# Patient Record
Sex: Male | Born: 1990
Health system: Southern US, Community
[De-identification: ages and names within clinical notes are randomized; demographics above are authoritative.]

---

## 2019-01-30 ENCOUNTER — Other Ambulatory Visit: Payer: Self-pay

## 2019-01-30 DIAGNOSIS — Z20822 Contact with and (suspected) exposure to covid-19: Secondary | ICD-10-CM

## 2019-01-31 LAB — NOVEL CORONAVIRUS, NAA: SARS-CoV-2, NAA: NOT DETECTED

## 2019-02-09 ENCOUNTER — Telehealth: Payer: Self-pay | Admitting: General Practice

## 2019-02-09 NOTE — Telephone Encounter (Signed)
Patient is calling for his negative COVID test results. Patient expressed understanding. °

## 2020-07-23 ENCOUNTER — Observation Stay (HOSPITAL_COMMUNITY): Payer: Self-pay

## 2020-07-23 ENCOUNTER — Inpatient Hospital Stay (HOSPITAL_COMMUNITY)
Admission: EM | Admit: 2020-07-23 | Discharge: 2020-07-27 | DRG: 199 | Disposition: A | Discharge status: Home / Self Care | Payer: Self-pay | Attending: Surgery | Admitting: Surgery

## 2020-07-23 ENCOUNTER — Emergency Department (HOSPITAL_COMMUNITY): Payer: Self-pay

## 2020-07-23 ENCOUNTER — Encounter (HOSPITAL_COMMUNITY): Payer: Self-pay

## 2020-07-23 ENCOUNTER — Other Ambulatory Visit: Payer: Self-pay

## 2020-07-23 DIAGNOSIS — S00511A Abrasion of lip, initial encounter: Secondary | ICD-10-CM | POA: Diagnosis present

## 2020-07-23 DIAGNOSIS — S0993XA Unspecified injury of face, initial encounter: Secondary | ICD-10-CM

## 2020-07-23 DIAGNOSIS — Z4682 Encounter for fitting and adjustment of non-vascular catheter: Secondary | ICD-10-CM

## 2020-07-23 DIAGNOSIS — F17200 Nicotine dependence, unspecified, uncomplicated: Secondary | ICD-10-CM | POA: Diagnosis present

## 2020-07-23 DIAGNOSIS — Z23 Encounter for immunization: Secondary | ICD-10-CM

## 2020-07-23 DIAGNOSIS — S270XXA Traumatic pneumothorax, initial encounter: Principal | ICD-10-CM | POA: Diagnosis present

## 2020-07-23 DIAGNOSIS — U071 COVID-19: Secondary | ICD-10-CM | POA: Diagnosis present

## 2020-07-23 DIAGNOSIS — F1910 Other psychoactive substance abuse, uncomplicated: Secondary | ICD-10-CM | POA: Diagnosis present

## 2020-07-23 DIAGNOSIS — F10921 Alcohol use, unspecified with intoxication delirium: Secondary | ICD-10-CM | POA: Diagnosis present

## 2020-07-23 DIAGNOSIS — Y908 Blood alcohol level of 240 mg/100 ml or more: Secondary | ICD-10-CM | POA: Diagnosis present

## 2020-07-23 DIAGNOSIS — Z9689 Presence of other specified functional implants: Secondary | ICD-10-CM

## 2020-07-23 DIAGNOSIS — F1210 Cannabis abuse, uncomplicated: Secondary | ICD-10-CM | POA: Diagnosis present

## 2020-07-23 DIAGNOSIS — J939 Pneumothorax, unspecified: Secondary | ICD-10-CM | POA: Diagnosis present

## 2020-07-23 DIAGNOSIS — S2242XA Multiple fractures of ribs, left side, initial encounter for closed fracture: Secondary | ICD-10-CM | POA: Diagnosis present

## 2020-07-23 LAB — RAPID URINE DRUG SCREEN, HOSP PERFORMED
Amphetamines: NOT DETECTED
Barbiturates: NOT DETECTED
Benzodiazepines: POSITIVE — AB
Cocaine: NOT DETECTED
Opiates: NOT DETECTED
Tetrahydrocannabinol: POSITIVE — AB

## 2020-07-23 LAB — URINALYSIS, ROUTINE W REFLEX MICROSCOPIC
Bilirubin Urine: NEGATIVE
Glucose, UA: NEGATIVE mg/dL
Hgb urine dipstick: NEGATIVE
Ketones, ur: 20 mg/dL — AB
Leukocytes,Ua: NEGATIVE
Nitrite: NEGATIVE
Protein, ur: NEGATIVE mg/dL
Specific Gravity, Urine: >1.046 — ABNORMAL HIGH (ref 1.005–1.030)
pH: 6 (ref 5.0–8.0)

## 2020-07-23 LAB — CBC WITH DIFFERENTIAL/PLATELET
Abs Immature Granulocytes: 0.06 10*3/uL (ref 0.00–0.07)
Basophils Absolute: 0 10*3/uL (ref 0.0–0.1)
Basophils Relative: 0 %
Eosinophils Absolute: 0 10*3/uL (ref 0.0–0.5)
Eosinophils Relative: 0 %
HCT: 49.9 % (ref 39.0–52.0)
Hemoglobin: 17 g/dL (ref 13.0–17.0)
Immature Granulocytes: 1 %
Lymphocytes Relative: 11 %
Lymphs Abs: 1.4 10*3/uL (ref 0.7–4.0)
MCH: 32.4 pg (ref 26.0–34.0)
MCHC: 34.1 g/dL (ref 30.0–36.0)
MCV: 95.2 fL (ref 80.0–100.0)
Monocytes Absolute: 0.5 10*3/uL (ref 0.1–1.0)
Monocytes Relative: 4 %
Neutro Abs: 10.6 10*3/uL — ABNORMAL HIGH (ref 1.7–7.7)
Neutrophils Relative %: 84 %
Platelets: 189 10*3/uL (ref 150–400)
RBC: 5.24 MIL/uL (ref 4.22–5.81)
RDW: 13.3 % (ref 11.5–15.5)
WBC: 12.5 10*3/uL — ABNORMAL HIGH (ref 4.0–10.5)
nRBC: 0 % (ref 0.0–0.2)

## 2020-07-23 LAB — COMPREHENSIVE METABOLIC PANEL
ALT: 22 U/L (ref 0–44)
AST: 29 U/L (ref 15–41)
Albumin: 4.5 g/dL (ref 3.5–5.0)
Alkaline Phosphatase: 60 U/L (ref 38–126)
Anion gap: 11 (ref 5–15)
BUN: 7 mg/dL (ref 6–20)
CO2: 23 mmol/L (ref 22–32)
Calcium: 8.5 mg/dL — ABNORMAL LOW (ref 8.9–10.3)
Chloride: 109 mmol/L (ref 98–111)
Creatinine, Ser: 0.76 mg/dL (ref 0.61–1.24)
GFR, Estimated: >60 mL/min (ref >60)
Glucose, Bld: 125 mg/dL — ABNORMAL HIGH (ref 70–99)
Potassium: 3.7 mmol/L (ref 3.5–5.1)
Sodium: 143 mmol/L (ref 135–145)
Total Bilirubin: 0.7 mg/dL (ref 0.3–1.2)
Total Protein: 7.3 g/dL (ref 6.5–8.1)

## 2020-07-23 LAB — HIV ANTIBODY (ROUTINE TESTING W REFLEX): HIV Screen 4th Generation wRfx: NONREACTIVE

## 2020-07-23 LAB — RESP PANEL BY RT-PCR (FLU A&B, COVID) ARPGX2
Influenza A by PCR: NEGATIVE
Influenza B by PCR: NEGATIVE
SARS Coronavirus 2 by RT PCR: POSITIVE — AB

## 2020-07-23 LAB — ETHANOL: Alcohol, Ethyl (B): 245 mg/dL — ABNORMAL HIGH (ref <10)

## 2020-07-23 LAB — LACTIC ACID, PLASMA: Lactic Acid, Venous: 2.5 mmol/L (ref 0.5–1.9)

## 2020-07-23 LAB — CK: Total CK: 819 U/L — ABNORMAL HIGH (ref 49–397)

## 2020-07-23 LAB — AMMONIA: Ammonia: 35 umol/L (ref 9–35)

## 2020-07-23 LAB — CBG MONITORING, ED: Glucose-Capillary: 117 mg/dL — ABNORMAL HIGH (ref 70–99)

## 2020-07-23 MED ORDER — ONDANSETRON 4 MG PO TBDP: 4.0000 mg | ORAL_TABLET | Freq: Four times a day (QID) | ORAL | Status: DC | PRN

## 2020-07-23 MED ORDER — FENTANYL CITRATE (PF) 100 MCG/2ML IJ SOLN
50.0000 ug | Freq: Once | INTRAMUSCULAR | Status: AC
  Administered 2020-07-23: 50 ug via INTRAVENOUS
  Filled 2020-07-23: qty 2

## 2020-07-23 MED ORDER — KCL IN DEXTROSE-NACL 20-5-0.45 MEQ/L-%-% IV SOLN
INTRAVENOUS | Status: DC
  Filled 2020-07-23: qty 1000

## 2020-07-23 MED ORDER — TETANUS-DIPHTH-ACELL PERTUSSIS 5-2.5-18.5 LF-MCG/0.5 IM SUSY
0.5000 mL | PREFILLED_SYRINGE | Freq: Once | INTRAMUSCULAR | Status: AC
  Administered 2020-07-23: 0.5 mL via INTRAMUSCULAR
  Filled 2020-07-23: qty 0.5

## 2020-07-23 MED ORDER — IOHEXOL 300 MG/ML  SOLN
100.0000 mL | Freq: Once | INTRAMUSCULAR | Status: AC | PRN
  Administered 2020-07-23: 100 mL via INTRAVENOUS

## 2020-07-23 MED ORDER — ENOXAPARIN SODIUM 30 MG/0.3ML ~~LOC~~ SOLN
30.0000 mg | Freq: Two times a day (BID) | SUBCUTANEOUS | Status: DC
  Administered 2020-07-24 – 2020-07-27 (×7): 30 mg via SUBCUTANEOUS
  Filled 2020-07-23 (×8): qty 0.3

## 2020-07-23 MED ORDER — DOCUSATE SODIUM 100 MG PO CAPS
100.0000 mg | ORAL_CAPSULE | Freq: Two times a day (BID) | ORAL | Status: DC
  Administered 2020-07-23 – 2020-07-27 (×8): 100 mg via ORAL
  Filled 2020-07-23 (×8): qty 1

## 2020-07-23 MED ORDER — HYDROCODONE-ACETAMINOPHEN 5-325 MG PO TABS
1.0000 | ORAL_TABLET | ORAL | Status: DC | PRN
  Administered 2020-07-23 – 2020-07-24 (×2): 1 via ORAL
  Filled 2020-07-23 (×2): qty 1

## 2020-07-23 MED ORDER — MORPHINE SULFATE (PF) 2 MG/ML IV SOLN
2.0000 mg | INTRAVENOUS | Status: DC | PRN
  Administered 2020-07-23 – 2020-07-24 (×5): 2 mg via INTRAVENOUS
  Filled 2020-07-23 (×5): qty 1

## 2020-07-23 MED ORDER — ONDANSETRON HCL 4 MG/2ML IJ SOLN
4.0000 mg | Freq: Once | INTRAMUSCULAR | Status: AC
  Administered 2020-07-23: 4 mg via INTRAVENOUS
  Filled 2020-07-23: qty 2

## 2020-07-23 MED ORDER — ZIPRASIDONE MESYLATE 20 MG IM SOLR
20.0000 mg | Freq: Once | INTRAMUSCULAR | Status: AC
  Administered 2020-07-23: 20 mg via INTRAMUSCULAR
  Filled 2020-07-23: qty 20

## 2020-07-23 MED ORDER — ONDANSETRON HCL 4 MG/2ML IJ SOLN: 4.0000 mg | Freq: Four times a day (QID) | INTRAMUSCULAR | Status: DC | PRN

## 2020-07-23 MED ORDER — SODIUM CHLORIDE 0.9 % IV BOLUS
1000.0000 mL | Freq: Once | INTRAVENOUS | Status: AC
  Administered 2020-07-23: 1000 mL via INTRAVENOUS

## 2020-07-23 MED ORDER — LORAZEPAM 2 MG/ML IJ SOLN
1.0000 mg | Freq: Once | INTRAMUSCULAR | Status: DC
  Filled 2020-07-23: qty 1

## 2020-07-23 NOTE — ED Notes (Signed)
[facility] called to transport patient to [facility]

## 2020-07-23 NOTE — ED Notes (Signed)
Bear hugger device applied to pt.

## 2020-07-23 NOTE — ED Notes (Signed)
Report given to [facility]. 
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
## 2020-07-23 NOTE — ED Notes (Signed)
Pt belongings located in cabinet behind nurses station [id number]

## 2020-07-23 NOTE — ED Notes (Signed)
Condom cath in place. Will give urine sample when able.

## 2020-07-23 NOTE — ED Notes (Signed)
RN will call back to receive report
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
## 2020-07-24 ENCOUNTER — Encounter (HOSPITAL_COMMUNITY): Payer: Self-pay

## 2020-07-24 ENCOUNTER — Observation Stay (HOSPITAL_COMMUNITY): Payer: Self-pay

## 2020-07-24 DIAGNOSIS — U071 COVID-19: Secondary | ICD-10-CM | POA: Diagnosis present

## 2020-07-24 DIAGNOSIS — F1910 Other psychoactive substance abuse, uncomplicated: Secondary | ICD-10-CM | POA: Diagnosis present

## 2020-07-24 DIAGNOSIS — J939 Pneumothorax, unspecified: Secondary | ICD-10-CM

## 2020-07-24 LAB — FERRITIN: Ferritin: 55 ng/mL (ref 24–336)

## 2020-07-24 LAB — D-DIMER, QUANTITATIVE: D-Dimer, Quant: 0.45 ug/mL-FEU (ref 0.00–0.50)

## 2020-07-24 LAB — FIBRINOGEN: Fibrinogen: 326 mg/dL (ref 210–475)

## 2020-07-24 LAB — PROCALCITONIN: Procalcitonin: <0.1 ng/mL

## 2020-07-24 LAB — C-REACTIVE PROTEIN: CRP: 5.3 mg/dL — ABNORMAL HIGH (ref <1.0)

## 2020-07-24 LAB — LACTATE DEHYDROGENASE: LDH: 150 U/L (ref 98–192)

## 2020-07-24 MED ORDER — OXYCODONE HCL 5 MG PO TABS
5.0000 mg | ORAL_TABLET | ORAL | Status: DC | PRN
  Administered 2020-07-24 – 2020-07-27 (×11): 10 mg via ORAL
  Filled 2020-07-24 (×11): qty 2

## 2020-07-24 MED ORDER — ACETAMINOPHEN 500 MG PO TABS
1000.0000 mg | ORAL_TABLET | Freq: Four times a day (QID) | ORAL | Status: DC
  Administered 2020-07-24 – 2020-07-27 (×11): 1000 mg via ORAL
  Filled 2020-07-24 (×14): qty 2

## 2020-07-24 MED ORDER — SODIUM CHLORIDE 0.9 % IV SOLN
100.0000 mg | Freq: Every day | INTRAVENOUS | Status: AC
  Administered 2020-07-25 – 2020-07-26 (×2): 100 mg via INTRAVENOUS
  Filled 2020-07-24 (×2): qty 20

## 2020-07-24 MED ORDER — METHOCARBAMOL 500 MG PO TABS
1000.0000 mg | ORAL_TABLET | Freq: Three times a day (TID) | ORAL | Status: DC
  Administered 2020-07-24 – 2020-07-27 (×11): 1000 mg via ORAL
  Filled 2020-07-24 (×10): qty 2

## 2020-07-24 MED ORDER — MORPHINE SULFATE (PF) 2 MG/ML IV SOLN
2.0000 mg | INTRAVENOUS | Status: DC | PRN
  Administered 2020-07-25 (×2): 2 mg via INTRAVENOUS
  Administered 2020-07-25: 4 mg via INTRAVENOUS
  Administered 2020-07-25 (×2): 2 mg via INTRAVENOUS
  Administered 2020-07-25: 4 mg via INTRAVENOUS
  Administered 2020-07-26 (×2): 2 mg via INTRAVENOUS
  Filled 2020-07-24 (×2): qty 1
  Filled 2020-07-24: qty 2
  Filled 2020-07-24: qty 1
  Filled 2020-07-24 (×2): qty 2
  Filled 2020-07-24 (×2): qty 1

## 2020-07-24 MED ORDER — KETOROLAC TROMETHAMINE 15 MG/ML IJ SOLN
15.0000 mg | Freq: Four times a day (QID) | INTRAMUSCULAR | Status: DC
  Administered 2020-07-24 – 2020-07-27 (×13): 15 mg via INTRAVENOUS
  Filled 2020-07-24 (×13): qty 1

## 2020-07-24 MED ORDER — SODIUM CHLORIDE 0.9 % IV SOLN
200.0000 mg | Freq: Once | INTRAVENOUS | Status: AC
  Administered 2020-07-24: 200 mg via INTRAVENOUS
  Filled 2020-07-24: qty 40
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
## 2020-07-24 NOTE — Progress Notes (Signed)
Received call from [facility] Radiology to notify RN chest Xray showed 20% increase in Left Pneumothorax, Rn notified Sherrie George PA with Surgery
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
## 2020-07-25 ENCOUNTER — Inpatient Hospital Stay (HOSPITAL_COMMUNITY): Payer: Self-pay

## 2020-07-25 MED ORDER — LIDOCAINE HCL (CARDIAC) PF 100 MG/5ML IV SOSY
PREFILLED_SYRINGE | INTRAVENOUS | Status: AC
  Filled 2020-07-25: qty 5

## 2020-07-25 MED ORDER — LIDOCAINE HCL URETHRAL/MUCOSAL 2 % EX GEL
1.0000 "application " | CUTANEOUS | Status: AC
  Filled 2020-07-25: qty 11

## 2020-07-25 MED ORDER — LIDOCAINE HCL 2 % IJ SOLN
20.0000 mL | Freq: Once | INTRAMUSCULAR | Status: DC
  Filled 2020-07-25: qty 20
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
## 2020-07-25 NOTE — Procedures (Signed)
   Procedure Note  Date: 07/25/2020  Procedure: tube thoracostomy--left    Pre-op diagnosis: left pneumothorax  Post-op diagnosis: same  Surgeon: Diamantina Monks, MD  Anesthesia: local   EBL: <5cc procedural Drains/Implants: 76F chest tube Specimen: none  Description of procedure: Time-out was performed verifying correct patient, procedure, site, laterality, and signature of informed consent. Five cc's of local anesthetic was infiltrated into the tissues just over the fourth intercostal space.  A small skin nick was made at the fourth intercostal space. An introducer needle was inserted and a guidewire inserted through the needle. The needle was removed and the tract dilated. The chest tube was inserted over the guidewire and the guidewire removed.   The tube was secured at the skin with suture and connected to an atrium at -20cm water wall suction. Immediate output from the chest tube was 0cc. The site was dressed with gauze and tape. The patient tolerated the procedure well. There were no complications. Follow up chest x-ray was ordered to confirm tube positioning, complete evacuation, and complete lung re-expansion.    Diamantina Monks, MD General and Trauma Surgery [facility] Surgery

## 2020-07-25 NOTE — Plan of Care (Signed)
  Problem: Education: Goal: Knowledge of General Education information will improve Description: Including pain rating scale, medication(s)/side effects and non-pharmacologic comfort measures Outcome: Progressing   Problem: Clinical Measurements: Goal: Ability to maintain clinical measurements within normal limits will improve Outcome: Progressing Goal: Will remain free from infection Outcome: Progressing Goal: Diagnostic test results will improve Outcome: Progressing Goal: Respiratory complications will improve Outcome: Progressing Goal: Cardiovascular complication will be avoided Outcome: Progressing   Problem: Activity: Goal: Risk for activity intolerance will decrease Outcome: Progressing   Problem: Nutrition: Goal: Adequate nutrition will be maintained Outcome: Progressing   Problem: Coping: Goal: Level of anxiety will decrease Outcome: Progressing   Problem: Respiratory: Goal: Will maintain a patent airway Outcome: Progressing Goal: Complications related to the disease process, condition or treatment will be avoided or minimized Outcome: Progressing
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
## 2020-07-25 NOTE — Progress Notes (Signed)
Consent for chest tube placement signed, Dr. Bedelia Person and Tresa Endo, PA at bedside placing chest tube.

## 2020-07-25 NOTE — Plan of Care (Signed)
  Problem: Safety: Goal: Violent Restraint(s) Outcome: Progressing   Problem: Education: Goal: Knowledge of General Education information will improve Description: Including pain rating scale, medication(s)/side effects and non-pharmacologic comfort measures Outcome: Progressing   Problem: Health Behavior/Discharge Planning: Goal: Ability to manage health-related needs will improve Outcome: Progressing   Problem: Clinical Measurements: Goal: Ability to maintain clinical measurements within normal limits will improve Outcome: Progressing Goal: Will remain free from infection Outcome: Progressing Goal: Diagnostic test results will improve Outcome: Progressing Goal: Respiratory complications will improve Outcome: Progressing Goal: Cardiovascular complication will be avoided Outcome: Progressing   Problem: Activity: Goal: Risk for activity intolerance will decrease Outcome: Progressing   Problem: Nutrition: Goal: Adequate nutrition will be maintained Outcome: Progressing   Problem: Coping: Goal: Level of anxiety will decrease Outcome: Progressing   Problem: Elimination: Goal: Will not experience complications related to bowel motility Outcome: Progressing Goal: Will not experience complications related to urinary retention Outcome: Progressing   Problem: Pain Managment: Goal: General experience of comfort will improve Outcome: Progressing   Problem: Safety: Goal: Ability to remain free from injury will improve Outcome: Progressing   Problem: Skin Integrity: Goal: Risk for impaired skin integrity will decrease Outcome: Progressing   Problem: Education: Goal: Knowledge of risk factors and measures for prevention of condition will improve Outcome: Progressing   Problem: Coping: Goal: Psychosocial and spiritual needs will be supported Outcome: Progressing   Problem: Respiratory: Goal: Will maintain a patent airway Outcome: Progressing Goal: Complications  related to the disease process, condition or treatment will be avoided or minimized Outcome: Progressing
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
## 2020-07-26 ENCOUNTER — Inpatient Hospital Stay (HOSPITAL_COMMUNITY): Payer: Self-pay

## 2020-07-26 MED ORDER — ORAL CARE MOUTH RINSE
15.0000 mL | Freq: Two times a day (BID) | OROMUCOSAL | Status: DC
  Administered 2020-07-26 – 2020-07-27 (×3): 15 mL via OROMUCOSAL

## 2020-07-26 NOTE — Progress Notes (Signed)
Patient's lip noted to have minimal bleeding this AM - when assessed, patient's inner lip (L side) is cracked and swollen; oral care done; moisturizer applied.
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
## 2020-07-27 ENCOUNTER — Other Ambulatory Visit: Payer: Self-pay | Admitting: General Surgery

## 2020-07-27 ENCOUNTER — Inpatient Hospital Stay (HOSPITAL_COMMUNITY): Payer: Self-pay

## 2020-07-27 MED ORDER — OXYCODONE HCL 5 MG PO TABS: 5.0000 mg | ORAL_TABLET | Freq: Four times a day (QID) | ORAL | 0 refills | Status: AC | PRN

## 2020-07-27 MED ORDER — METHOCARBAMOL 500 MG PO TABS: 1000.0000 mg | ORAL_TABLET | Freq: Three times a day (TID) | ORAL | 0 refills | Status: AC | PRN

## 2020-07-27 MED ORDER — IBUPROFEN 200 MG PO TABS
600.0000 mg | ORAL_TABLET | Freq: Three times a day (TID) | ORAL | 2 refills | Status: AC | PRN
Start: 2020-07-27 — End: 2021-07-27

## 2020-07-27 MED ORDER — ACETAMINOPHEN 500 MG PO TABS: 1000.0000 mg | ORAL_TABLET | Freq: Four times a day (QID) | ORAL | 0 refills | Status: AC | PRN

## 2020-07-27 MED FILL — METHOCARBAMOL 500 MG TABS: 500 | 5 days supply | Qty: 30 | Fill #0

## 2020-07-27 MED FILL — oxyCODONE HCL 5 MG TABS: 5 | 5 days supply | Qty: 20 | Fill #0

## 2020-07-27 NOTE — Progress Notes (Signed)
DC instructions reviewed with patient and mother at bedside. All questions asked and answered. PIV removed, discharging home with mother via personal transportation. Cleared for DC.

## 2020-07-27 NOTE — Plan of Care (Signed)
  Problem: Safety: Goal: Violent Restraint(s) Outcome: Progressing   Problem: Education: Goal: Knowledge of General Education information will improve Description: Including pain rating scale, medication(s)/side effects and non-pharmacologic comfort measures Outcome: Progressing   Problem: Health Behavior/Discharge Planning: Goal: Ability to manage health-related needs will improve Outcome: Progressing   Problem: Clinical Measurements: Goal: Ability to maintain clinical measurements within normal limits will improve Outcome: Progressing Goal: Will remain free from infection Outcome: Progressing Goal: Diagnostic test results will improve Outcome: Progressing Goal: Respiratory complications will improve Outcome: Progressing Goal: Cardiovascular complication will be avoided Outcome: Progressing   Problem: Activity: Goal: Risk for activity intolerance will decrease Outcome: Progressing   Problem: Nutrition: Goal: Adequate nutrition will be maintained Outcome: Progressing   Problem: Coping: Goal: Level of anxiety will decrease Outcome: Progressing   Problem: Elimination: Goal: Will not experience complications related to bowel motility Outcome: Progressing Goal: Will not experience complications related to urinary retention Outcome: Progressing   Problem: Pain Managment: Goal: General experience of comfort will improve Outcome: Progressing   Problem: Safety: Goal: Ability to remain free from injury will improve Outcome: Progressing   Problem: Skin Integrity: Goal: Risk for impaired skin integrity will decrease Outcome: Progressing   Problem: Education: Goal: Knowledge of risk factors and measures for prevention of condition will improve Outcome: Progressing   Problem: Coping: Goal: Psychosocial and spiritual needs will be supported Outcome: Progressing   Problem: Respiratory: Goal: Will maintain a patent airway Outcome: Progressing Goal: Complications  related to the disease process, condition or treatment will be avoided or minimized Outcome: Progressing   
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
## 2020-07-27 NOTE — Discharge Instructions (Signed)
Rib Fracture  A rib fracture is a break or crack in one of the bones of the ribs. The ribs are like a cage that goes around your upper chest. A broken or cracked rib is often painful, but most do not cause other problems. Most rib fractures usually heal on their own in 1-3 months. What are the causes?  Doing movements over and over again with a lot of force, such as pitching a baseball or having a very bad cough.  A direct hit to the chest.  Cancer that has spread to the bones. What are the signs or symptoms?  Pain when you breathe in or cough.  Pain when someone presses on the injured area.  Feeling short of breath. How is this treated? Treatment depends on how bad the fracture is. In general:  Most rib fractures usually heal on their own in 1-3 months.  Healing may take longer if you have a cough or are doing activities that make the injury worse.  While you heal, you may be given medicines to control pain.  You will also be taught deep breathing exercises.  Very bad injuries may require a stay at the hospital or surgery. Follow these instructions at home: Managing pain, stiffness, and swelling  If told, put ice on the injured area. To do this: ? Put ice in a plastic bag. ? Place a towel between your skin and the bag. ? Leave the ice on for 20 minutes, 2-3 times a day. ? Take off the ice if your skin turns bright red. This is very important. If you cannot feel pain, heat, or cold, you have a greater risk of damage to the area.  Take over-the-counter and prescription medicines only as told by your doctor. Activity  Avoid activities that cause pain to the injured area. Protect your injured area.  Slowly increase activity as told by your doctor. General instructions  Do deep breathing exercises as told by your doctor. You may be told to: ? Take deep breaths many times a day. ? Cough several times a day while hugging a pillow. ? Use a device (incentive spirometer) to do  deep breathing many times a day.  Drink enough fluid to keep your pee (urine) clear or pale yellow.  Do not wear a rib belt or binder.  Keep all follow-up visits. Contact a doctor if:  You have a fever. Get help right away if:  You have trouble breathing.  You are short of breath.  You cannot stop coughing.  You cough up thick or bloody spit.  You feel like you may vomit (nauseous), vomit, or have belly (abdominal) pain.  Your pain gets worse and medicine does not help. These symptoms may be an emergency. Get help right away. Call your local emergency services (911 in the [place]  Do not wait to see if the symptoms will go away.  Do not drive yourself to the hospital. Summary  A rib fracture is a break or crack in one of the bones of the ribs.  Apply ice to the injured area and take medicines for pain as told by your doctor.  Take deep breaths and cough several times a day. Hug a pillow every time you cough. This information is not intended to replace advice given to you by your health care provider. Make sure you discuss any questions you have with your health care provider. Document Revised: 08/07/2019 Document Reviewed: 08/07/2019 Elsevier Patient Education  2021 [employer].  Pneumothorax A pneumothorax is commonly called a collapsed lung. It is a condition in which air leaks from a lung and builds up between the thin layer of tissue that covers the lungs (visceral pleura) and the interior wall of the chest cavity (parietal pleura). The air gets trapped outside the lung, between the lung and the chest wall (pleural space). The air takes up space and prevents the lung from fully expanding. This condition sometimes occurs suddenly with no apparent cause. The buildup of air may be small or large. A small pneumothorax may go away on its own. A large pneumothorax will require treatment and hospitalization. What are the causes? This condition may be caused by:  Trauma  and injury to the chest wall.  Surgery and other medical procedures.  A complication of an underlying lung problem, especially chronic obstructive pulmonary disease (COPD) or emphysema. Sometimes the cause of this condition is not known. What increases the risk? You are more likely to develop this condition if:  You have an underlying lung problem.  You smoke.  You are 77-44 years old, male, tall, and underweight.  You have a personal or family history of pneumothorax.  You have an eating disorder (anorexia nervosa). This condition can also happen quickly, even in people with no history of lung problems. What are the signs or symptoms? Sometimes a pneumothorax will have no symptoms. When symptoms are present, they can include:  Chest pain.  Shortness of breath.  Increased rate of breathing.  Bluish color to your lips or skin (cyanosis). How is this diagnosed? This condition may be diagnosed by:  A medical history and physical exam.  A chest X-ray, chest CT scan, or ultrasound. How is this treated? Treatment depends on how severe your condition is. The goal of treatment is to remove the extra air and allow your lung to expand back to its normal size.  For a small pneumothorax: ? No treatment may be needed. ? Extra oxygen is sometimes used to make it go away more quickly.  For a large pneumothorax or one that is causing symptoms, a procedure is done to release the air from around your lungs. To do this, a health care provider may use: ? A needle with a syringe. This is used to suck air from a pleural space where no additional leakage is taking place. ? A chest tube. This is used to suck air where there is ongoing leakage into the pleural space. The chest tube may need to remain in place for several days until the air leak has healed.  In more severe cases, surgery may be needed to repair the damage that is causing the leak.  If you have multiple pneumothorax episodes or  have an air leak that will not heal, a procedure called a pleurodesis may be done. A medicine is placed in the pleural space to irritate the tissues around the lung so that the lung will stick to the chest wall, seal any leaks, and stop any buildup of air in that space. If you have an underlying lung problem, severe symptoms, or a large pneumothorax you will usually need to stay in the hospital.   Follow these instructions at home: Lifestyle  Do not use any products that contain nicotine or tobacco, such as cigarettes and e-cigarettes. These are major risk factors in pneumothorax. If you need help quitting, ask your health care provider.  Do not lift anything that is heavier than 10 lb (4.5 kg), or the limit that  your health care provider tells you, until he or she says that it is safe.  Avoid activities that take a lot of effort (strenuous) for as long as told by your health care provider.  Return to your normal activities as told by your health care provider. Ask your health care provider what activities are safe for you.  Do not fly in an airplane or scuba dive until your health care provider says it is okay. General instructions  Take over-the-counter and prescription medicines only as told by your health care provider.  If a cough or pain makes it difficult for you to sleep at night, try sleeping in a semi-upright position in a recliner or by using 2 or 3 pillows.  If you had a chest tube and it was removed, ask your health care provider when you can remove the bandage (dressing). While the dressing is in place, do not allow it to get wet.  Keep all follow-up visits as told by your health care provider. This is important. Contact a health care provider if:  You cough up thick mucus (sputum) that is yellow or green in color.  You were treated with a chest tube, and you have redness, increasing pain, or discharge at the site where it was placed. Get help right away if:  You have  increasing chest pain or shortness of breath.  You have a cough that will not go away.  You begin coughing up blood.  You have pain that is getting worse or is not controlled with medicines.  The site where your chest tube was located opens up.  You feel air coming out of the site where the chest tube was placed.  You have a fever or persistent symptoms for more than 2-3 days. These symptoms may represent a serious problem that is an emergency. Do not wait to see if the symptoms will go away. Get medical help right away. Call your local emergency services (911 in the [place] Do not drive yourself to the hospital. Summary  A pneumothorax, commonly called a collapsed lung, is a condition in which air leaks from a lung and gets trapped between the lung and the chest wall (pleural space).  The buildup of air may be small or large. A small pneumothorax may go away on its own. A large pneumothorax will require treatment and hospitalization.  Treatment for this condition depends on how severe the pneumothorax is. The goal of treatment is to remove the extra air and allow the lung to expand back to its normal size. This information is not intended to replace advice given to you by your health care provider. Make sure you discuss any questions you have with your health care provider. Document Revised: 12/17/2019 Document Reviewed: 12/17/2019 Elsevier Patient Education  2021 [employer].

## 2020-08-10 ENCOUNTER — Other Ambulatory Visit: Payer: Self-pay | Admitting: General Surgery

## 2020-08-10 ENCOUNTER — Ambulatory Visit
Admission: RE | Admit: 2020-08-10 | Discharge: 2020-08-10 | Disposition: A | Discharge status: Home / Self Care | Payer: Self-pay | Source: Ambulatory Visit | Attending: General Surgery | Admitting: General Surgery

## 2020-08-10 ENCOUNTER — Other Ambulatory Visit: Payer: Self-pay

## 2020-08-10 ENCOUNTER — Other Ambulatory Visit: Payer: Self-pay | Admitting: General Practice

## 2020-08-10 DIAGNOSIS — J939 Pneumothorax, unspecified: Secondary | ICD-10-CM

## 2022-01-27 ENCOUNTER — Emergency Department (HOSPITAL_COMMUNITY): Payer: No Typology Code available for payment source

## 2022-01-27 ENCOUNTER — Emergency Department (HOSPITAL_COMMUNITY)
Admission: EM | Admit: 2022-01-27 | Discharge: 2022-01-27 | Discharge status: Against Medical Advice | Payer: No Typology Code available for payment source | Attending: Emergency Medicine | Admitting: Emergency Medicine

## 2022-01-27 ENCOUNTER — Encounter (HOSPITAL_COMMUNITY): Payer: Self-pay | Admitting: Emergency Medicine

## 2022-01-27 ENCOUNTER — Emergency Department (HOSPITAL_COMMUNITY)
Admission: EM | Admit: 2022-01-27 | Discharge: 2022-01-27 | Disposition: A | Discharge status: Home / Self Care | Payer: No Typology Code available for payment source | Source: Home / Self Care | Attending: Emergency Medicine | Admitting: Emergency Medicine

## 2022-01-27 ENCOUNTER — Other Ambulatory Visit: Payer: Self-pay

## 2022-01-27 ENCOUNTER — Encounter (HOSPITAL_COMMUNITY): Payer: Self-pay

## 2022-01-27 DIAGNOSIS — M545 Low back pain, unspecified: Secondary | ICD-10-CM | POA: Insufficient documentation

## 2022-01-27 DIAGNOSIS — Z5321 Procedure and treatment not carried out due to patient leaving prior to being seen by health care provider: Secondary | ICD-10-CM | POA: Insufficient documentation

## 2022-01-27 DIAGNOSIS — M25512 Pain in left shoulder: Secondary | ICD-10-CM | POA: Insufficient documentation

## 2022-01-27 DIAGNOSIS — R519 Headache, unspecified: Secondary | ICD-10-CM | POA: Insufficient documentation

## 2022-01-27 DIAGNOSIS — Y92410 Unspecified street and highway as the place of occurrence of the external cause: Secondary | ICD-10-CM | POA: Diagnosis not present

## 2022-01-27 DIAGNOSIS — M542 Cervicalgia: Secondary | ICD-10-CM | POA: Insufficient documentation

## 2022-01-27 DIAGNOSIS — M25561 Pain in right knee: Secondary | ICD-10-CM | POA: Insufficient documentation

## 2022-01-27 MED ORDER — IBUPROFEN 800 MG PO TABS
800.0000 mg | ORAL_TABLET | Freq: Once | ORAL | Status: AC
  Administered 2022-01-27: 800 mg via ORAL
  Filled 2022-01-27: qty 1

## 2022-01-27 MED ORDER — CYCLOBENZAPRINE HCL 5 MG PO TABS: 10.0000 mg | ORAL_TABLET | Freq: Two times a day (BID) | ORAL | 0 refills | Status: AC | PRN
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
## 2022-01-27 NOTE — ED Notes (Signed)
Pt no longer in lobby.  
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
## 2022-03-13 IMAGING — DX DG CHEST 1V PORT
1 series · 1 of 1 positions shown · non-contrast
Comparison: 07/24/2020 and CT chest 07/23/2020.

CLINICAL DATA: Pneumothorax, COVID.

EXAM:
PORTABLE CHEST 1 VIEW

[chest ap]
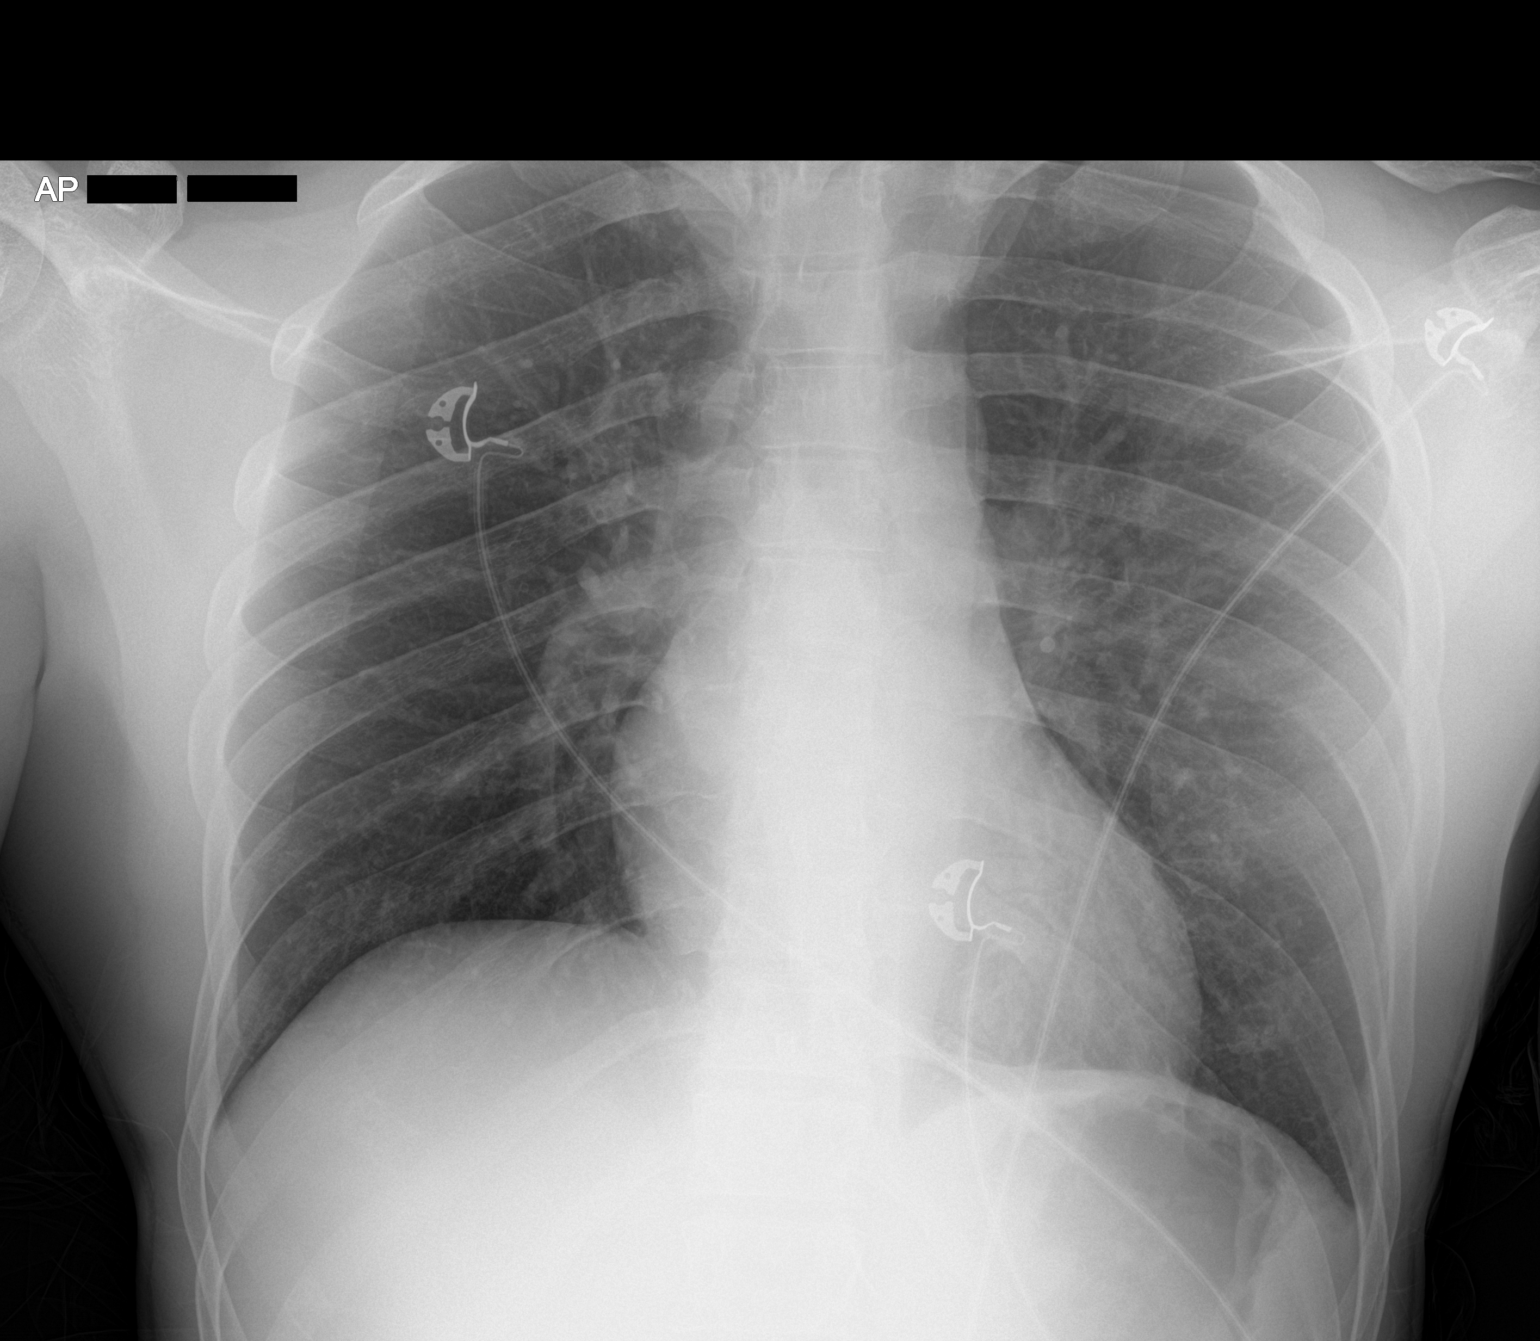

[1 of 1 positions shown; findings below may reference images not displayed]

FINDINGS: Small left pneumothorax, minimally decreased in size from yesterday.
Minimal subsegmental atelectasis in the left perihilar region and
left lower lobe. Right lung is clear. Left rib fractures are better
seen on 07/23/2020.
IMPRESSION: 1. Small left pneumothorax, minimally decreased in size from
yesterday.
2. Left perihilar and left basilar subsegmental atelectasis.
3. Left rib fractures better seen on 07/23/2020.
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
## 2022-03-15 IMAGING — DX DG CHEST 1V PORT
1 series · 1 of 1 positions shown · non-contrast
Comparison: July 27, 2020 study obtained earlier in the day

CLINICAL DATA: Chest tube removal

EXAM:
PORTABLE CHEST 1 VIEW

[chest ap]
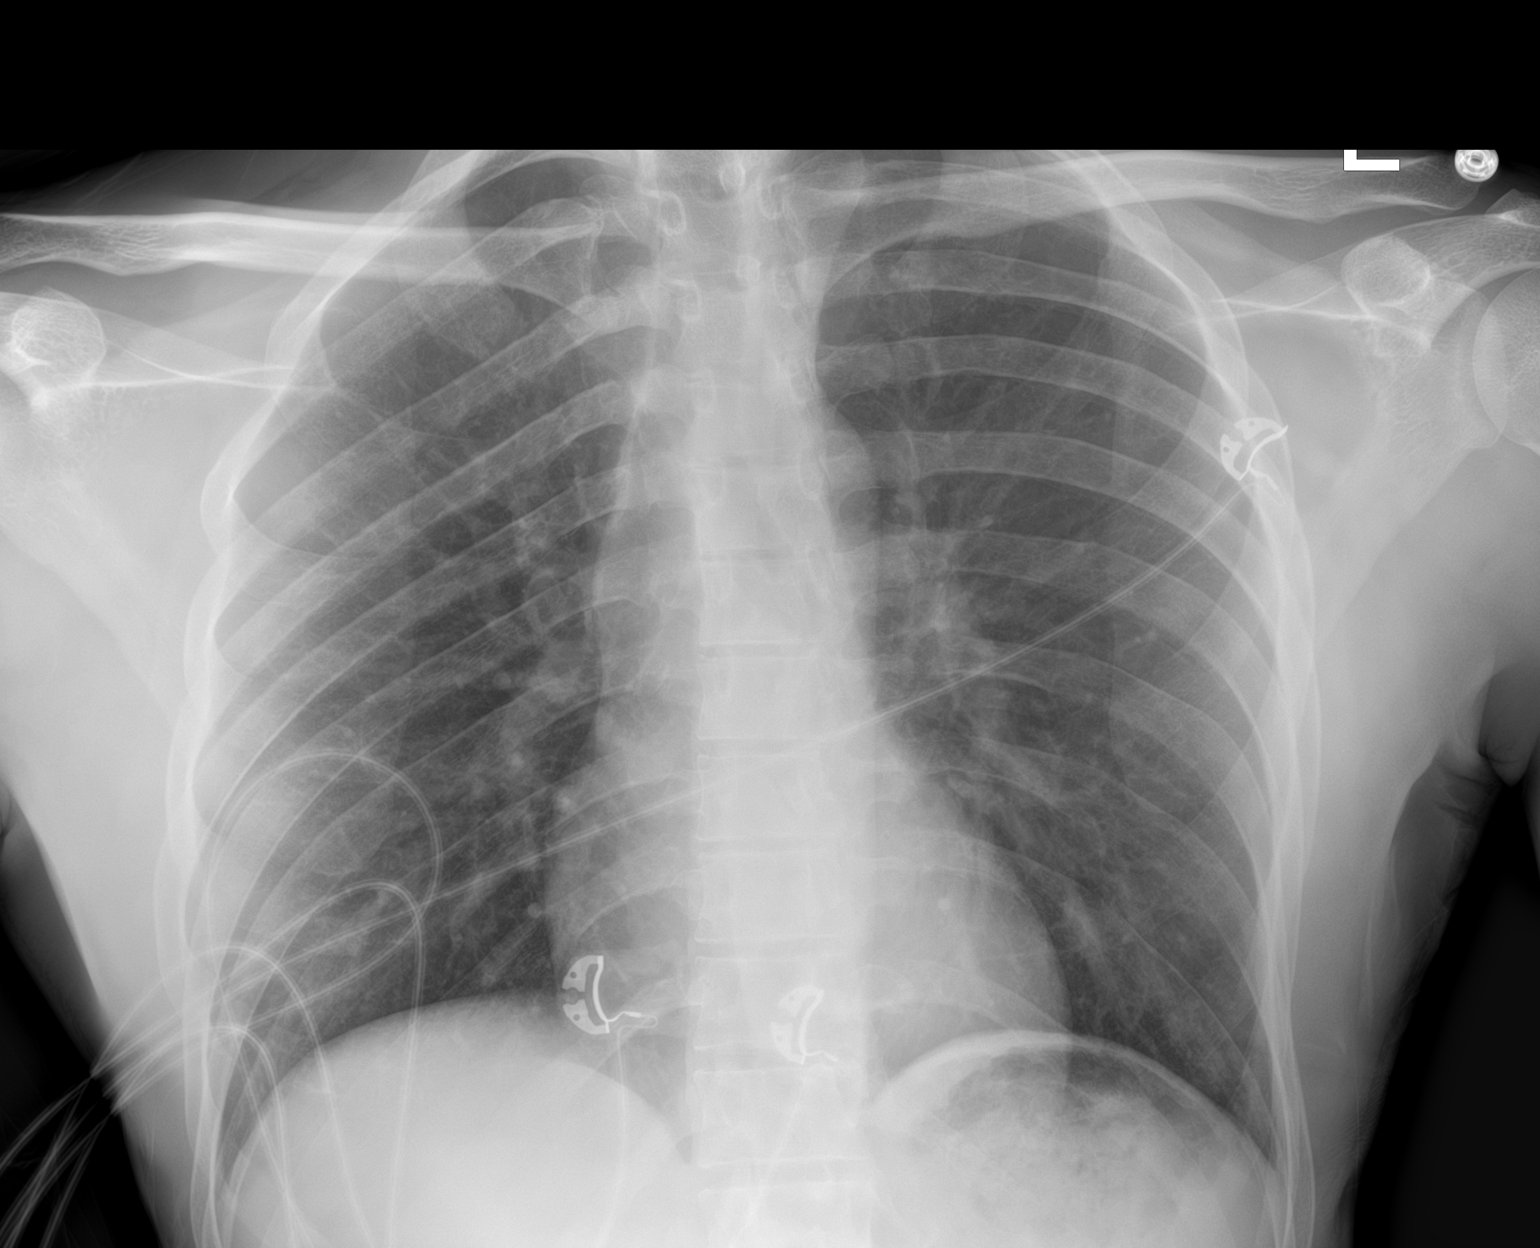

[1 of 1 positions shown; findings below may reference images not displayed]

FINDINGS: Chest tube has been removed from the left side. No pneumothorax.
Lungs clear. Heart size and pulmonary vascular normal. No
adenopathy. No bone lesions.
IMPRESSION: Chest tube removed from the left without pneumothorax. Lungs clear.
Heart size normal.
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
## 2023-03-14 DIAGNOSIS — M7710 Lateral epicondylitis, unspecified elbow: Secondary | ICD-10-CM | POA: Diagnosis not present

## 2023-03-14 DIAGNOSIS — M25521 Pain in right elbow: Secondary | ICD-10-CM | POA: Diagnosis not present
